# Patient Record
Sex: Male | Born: 1945 | Race: White | Hispanic: No | Marital: Married | Smoking: Former smoker
Health system: Southern US, Community
[De-identification: ages and names within clinical notes are randomized; demographics above are authoritative.]

## PROBLEM LIST (undated history)

## (undated) DIAGNOSIS — G4733 Obstructive sleep apnea (adult) (pediatric): Secondary | ICD-10-CM

## (undated) DIAGNOSIS — M65849 Other synovitis and tenosynovitis, unspecified hand: Secondary | ICD-10-CM

## (undated) DIAGNOSIS — R351 Nocturia: Secondary | ICD-10-CM

## (undated) DIAGNOSIS — Z9989 Dependence on other enabling machines and devices: Secondary | ICD-10-CM

## (undated) DIAGNOSIS — D126 Benign neoplasm of colon, unspecified: Secondary | ICD-10-CM

## (undated) DIAGNOSIS — I471 Supraventricular tachycardia: Secondary | ICD-10-CM

## (undated) DIAGNOSIS — M542 Cervicalgia: Secondary | ICD-10-CM

## (undated) DIAGNOSIS — M65839 Other synovitis and tenosynovitis, unspecified forearm: Secondary | ICD-10-CM

## (undated) DIAGNOSIS — R079 Chest pain, unspecified: Secondary | ICD-10-CM

## (undated) DIAGNOSIS — C4021 Malignant neoplasm of long bones of right lower limb: Secondary | ICD-10-CM

## (undated) DIAGNOSIS — I4719 Other supraventricular tachycardia: Secondary | ICD-10-CM

## (undated) DIAGNOSIS — N529 Male erectile dysfunction, unspecified: Secondary | ICD-10-CM

## (undated) HISTORY — DX: Other synovitis and tenosynovitis, unspecified forearm: M65.839

## (undated) HISTORY — DX: Supraventricular tachycardia: I47.1

## (undated) HISTORY — DX: Obstructive sleep apnea (adult) (pediatric): G47.33

## (undated) HISTORY — DX: Other synovitis and tenosynovitis, unspecified hand: M65.849

## (undated) HISTORY — PX: TUMOR REMOVAL: SHX12

## (undated) HISTORY — DX: Nocturia: R35.1

## (undated) HISTORY — PX: POLYPECTOMY: SHX149

## (undated) HISTORY — DX: Dependence on other enabling machines and devices: Z99.89

## (undated) HISTORY — DX: Cervicalgia: M54.2

## (undated) HISTORY — PX: COLONOSCOPY: SHX174

## (undated) HISTORY — DX: Benign neoplasm of colon, unspecified: D12.6

## (undated) HISTORY — DX: Chest pain, unspecified: R07.9

## (undated) HISTORY — DX: Other supraventricular tachycardia: I47.19

## (undated) HISTORY — DX: Malignant neoplasm of long bones of right lower limb: C40.21

## (undated) HISTORY — DX: Male erectile dysfunction, unspecified: N52.9

---

## 1997-03-10 ENCOUNTER — Encounter: Payer: Self-pay | Admitting: Pulmonary Disease

## 1997-04-12 ENCOUNTER — Encounter: Payer: Self-pay | Admitting: Pulmonary Disease

## 1997-04-28 ENCOUNTER — Encounter: Payer: Self-pay | Admitting: Pulmonary Disease

## 2004-02-10 ENCOUNTER — Ambulatory Visit: Payer: Self-pay | Admitting: Family Medicine

## 2005-10-06 ENCOUNTER — Encounter: Admission: RE | Admit: 2005-10-06 | Discharge: 2005-10-06 | Payer: Self-pay | Admitting: Family Medicine

## 2005-10-06 ENCOUNTER — Ambulatory Visit: Payer: Self-pay | Admitting: Family Medicine

## 2005-10-14 ENCOUNTER — Ambulatory Visit: Payer: Self-pay | Admitting: Internal Medicine

## 2005-12-25 ENCOUNTER — Emergency Department (HOSPITAL_COMMUNITY): Admission: EM | Admit: 2005-12-25 | Discharge: 2005-12-25 | Payer: Self-pay | Admitting: Emergency Medicine

## 2005-12-27 ENCOUNTER — Ambulatory Visit: Payer: Self-pay | Admitting: Family Medicine

## 2005-12-29 ENCOUNTER — Ambulatory Visit: Payer: Self-pay | Admitting: Family Medicine

## 2006-04-19 ENCOUNTER — Encounter: Admission: RE | Admit: 2006-04-19 | Discharge: 2006-07-18 | Payer: Self-pay | Admitting: Neurology

## 2006-04-19 ENCOUNTER — Ambulatory Visit: Payer: Self-pay | Admitting: Family Medicine

## 2006-11-04 ENCOUNTER — Inpatient Hospital Stay (HOSPITAL_COMMUNITY): Admission: EM | Admit: 2006-11-04 | Discharge: 2006-11-07 | Payer: Self-pay | Admitting: Emergency Medicine

## 2007-10-15 ENCOUNTER — Emergency Department (HOSPITAL_COMMUNITY): Admission: EM | Admit: 2007-10-15 | Discharge: 2007-10-15 | Payer: Self-pay | Admitting: Emergency Medicine

## 2007-11-05 ENCOUNTER — Telehealth: Payer: Self-pay | Admitting: Family Medicine

## 2007-11-08 ENCOUNTER — Ambulatory Visit: Payer: Self-pay | Admitting: Family Medicine

## 2007-11-08 DIAGNOSIS — M542 Cervicalgia: Secondary | ICD-10-CM

## 2007-11-08 HISTORY — DX: Cervicalgia: M54.2

## 2007-11-09 ENCOUNTER — Ambulatory Visit: Payer: Self-pay | Admitting: Family Medicine

## 2007-11-13 ENCOUNTER — Telehealth: Payer: Self-pay | Admitting: Family Medicine

## 2007-12-12 ENCOUNTER — Encounter: Payer: Self-pay | Admitting: Family Medicine

## 2007-12-13 ENCOUNTER — Ambulatory Visit: Payer: Self-pay | Admitting: Family Medicine

## 2008-10-20 ENCOUNTER — Ambulatory Visit: Payer: Self-pay | Admitting: Family Medicine

## 2008-10-20 LAB — CONVERTED CEMR LAB
ALT: 21 units/L (ref 0–53)
AST: 17 units/L (ref 0–37)
Alkaline Phosphatase: 73 units/L (ref 39–117)
Basophils Absolute: 0 10*3/uL (ref 0.0–0.1)
Blood in Urine, dipstick: NEGATIVE
Calcium: 9 mg/dL (ref 8.4–10.5)
Eosinophils Relative: 2.8 % (ref 0.0–5.0)
GFR calc non Af Amer: 72.25 mL/min (ref >60)
Glucose, Bld: 99 mg/dL (ref 70–99)
HCT: 45.5 % (ref 39.0–52.0)
HDL: 44 mg/dL (ref >39.00)
Hemoglobin: 15.6 g/dL (ref 13.0–17.0)
Ketones, urine, test strip: NEGATIVE
Lymphocytes Relative: 38.2 % (ref 12.0–46.0)
Lymphs Abs: 3.2 10*3/uL (ref 0.7–4.0)
Monocytes Relative: 7.9 % (ref 3.0–12.0)
Neutro Abs: 4.3 10*3/uL (ref 1.4–7.7)
Nitrite: NEGATIVE
Platelets: 175 10*3/uL (ref 150.0–400.0)
Potassium: 4.7 meq/L (ref 3.5–5.1)
Protein, U semiquant: NEGATIVE
RDW: 12.5 % (ref 11.5–14.6)
Sodium: 145 meq/L (ref 135–145)
Total Bilirubin: 0.9 mg/dL (ref 0.3–1.2)
Triglycerides: 108 mg/dL (ref 0.0–149.0)
Urobilinogen, UA: 0.2
VLDL: 21.6 mg/dL (ref 0.0–40.0)
WBC Urine, dipstick: NEGATIVE
WBC: 8.4 10*3/uL (ref 4.5–10.5)

## 2008-11-13 ENCOUNTER — Ambulatory Visit: Payer: Self-pay | Admitting: Family Medicine

## 2008-11-13 DIAGNOSIS — R079 Chest pain, unspecified: Secondary | ICD-10-CM

## 2008-11-13 DIAGNOSIS — N529 Male erectile dysfunction, unspecified: Secondary | ICD-10-CM

## 2008-11-13 DIAGNOSIS — R351 Nocturia: Secondary | ICD-10-CM

## 2008-11-13 HISTORY — DX: Male erectile dysfunction, unspecified: N52.9

## 2008-11-13 HISTORY — DX: Nocturia: R35.1

## 2008-11-17 ENCOUNTER — Telehealth (INDEPENDENT_AMBULATORY_CARE_PROVIDER_SITE_OTHER): Payer: Self-pay | Admitting: *Deleted

## 2008-11-17 ENCOUNTER — Encounter: Payer: Self-pay | Admitting: *Deleted

## 2008-11-18 ENCOUNTER — Encounter: Payer: Self-pay | Admitting: Cardiology

## 2008-11-18 ENCOUNTER — Ambulatory Visit: Payer: Self-pay

## 2008-11-21 LAB — CONVERTED CEMR LAB: Testosterone: 281.09 ng/dL — ABNORMAL LOW (ref 350.00–890.00)

## 2009-01-01 ENCOUNTER — Encounter (INDEPENDENT_AMBULATORY_CARE_PROVIDER_SITE_OTHER): Payer: Self-pay | Admitting: *Deleted

## 2009-01-21 ENCOUNTER — Encounter (INDEPENDENT_AMBULATORY_CARE_PROVIDER_SITE_OTHER): Payer: Self-pay | Admitting: *Deleted

## 2009-01-29 ENCOUNTER — Ambulatory Visit: Payer: Self-pay | Admitting: Gastroenterology

## 2009-02-04 ENCOUNTER — Ambulatory Visit: Payer: Self-pay | Admitting: Gastroenterology

## 2009-02-04 LAB — HM COLONOSCOPY

## 2009-02-06 ENCOUNTER — Encounter: Payer: Self-pay | Admitting: Gastroenterology

## 2009-02-06 DIAGNOSIS — D126 Benign neoplasm of colon, unspecified: Secondary | ICD-10-CM | POA: Insufficient documentation

## 2009-02-06 HISTORY — DX: Benign neoplasm of colon, unspecified: D12.6

## 2009-02-19 ENCOUNTER — Ambulatory Visit: Payer: Self-pay | Admitting: Genetic Counselor

## 2009-03-06 ENCOUNTER — Encounter: Payer: Self-pay | Admitting: Gastroenterology

## 2009-03-06 ENCOUNTER — Telehealth (INDEPENDENT_AMBULATORY_CARE_PROVIDER_SITE_OTHER): Payer: Self-pay | Admitting: *Deleted

## 2009-03-27 ENCOUNTER — Ambulatory Visit: Payer: Self-pay | Admitting: Family Medicine

## 2009-03-27 DIAGNOSIS — M65849 Other synovitis and tenosynovitis, unspecified hand: Secondary | ICD-10-CM

## 2009-03-27 DIAGNOSIS — M65839 Other synovitis and tenosynovitis, unspecified forearm: Secondary | ICD-10-CM

## 2009-03-27 HISTORY — DX: Other synovitis and tenosynovitis, unspecified forearm: M65.839

## 2009-04-07 ENCOUNTER — Ambulatory Visit: Payer: Self-pay | Admitting: Pulmonary Disease

## 2009-04-07 DIAGNOSIS — G4733 Obstructive sleep apnea (adult) (pediatric): Secondary | ICD-10-CM | POA: Insufficient documentation

## 2009-04-07 DIAGNOSIS — Z9989 Dependence on other enabling machines and devices: Secondary | ICD-10-CM

## 2009-04-17 ENCOUNTER — Telehealth: Payer: Self-pay | Admitting: Pulmonary Disease

## 2009-05-25 ENCOUNTER — Encounter: Payer: Self-pay | Admitting: Pulmonary Disease

## 2009-05-26 ENCOUNTER — Encounter: Payer: Self-pay | Admitting: Pulmonary Disease

## 2009-06-17 ENCOUNTER — Telehealth (INDEPENDENT_AMBULATORY_CARE_PROVIDER_SITE_OTHER): Payer: Self-pay | Admitting: *Deleted
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
## 2010-01-12 ENCOUNTER — Encounter: Payer: Self-pay | Admitting: Gastroenterology

## 2010-03-23 NOTE — Progress Notes (Signed)
Summary: [facility]   Imported By: Sherian Rein 04/07/2009 15:00:14  _____________________________________________________________________  External Attachment:    Type:   Image     Comment:   External Document

## 2010-03-23 NOTE — Assessment & Plan Note (Signed)
Summary: rov for osa, re-establish.   Copy to:  Kelle Darting Primary Provider/Referring Provider:  Roderick Pee MD  CC:  Sleep Consult.  History of Present Illness: The pt comes in today for f/u of his severe osa.  He was diagnosed in 1999 and started on cpap, and never followed up since.  He never had his pressure optimized for him.  He comes in today because he is in need of a mask, with his last one breaking.  He admits that his current cpap machine was the original, and he has noted the pressure seems to be getting less and less.  Bed partner has noticed breakthru snoring.  He goes to bed btw 10-58mn, and arises at 6-7am to start his day.  He has seen a big difference in how he sleeps and feels while wearing cpap, but recently has had an increase in symptoms.  His weight is up 15 pounds over the last one year.  His epworth score today is 7.  Medications Prior to Update: 1)  Multivitamins  Tabs (Multiple Vitamin) .... Once Daily 2)  Aspirin 81 Mg Tbec (Aspirin) .... Once Daily 3)  Prednisone 20 Mg Tabs (Prednisone) .... 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 Family History Diabetes 1st degree relative (mother  Family History of Prostate CA 1st degree relative <50  (father)  heart disease: mother  (CHF)  Social History: Reviewed history from 10/06/2006 and no changes required. former smoker.  started at age 82.  1 ppd.  quit 2011. Married has children. works as a Insurance claims handler. Alcohol use-yes  Review of Systems  The patient denies shortness of  breath with activity, shortness of breath at rest, productive cough, non-productive cough, coughing up blood, chest pain, irregular heartbeats, acid heartburn, indigestion, loss of appetite, weight change, abdominal pain, difficulty swallowing, sore throat, tooth/dental problems, headaches, nasal congestion/difficulty breathing through nose, sneezing, itching, ear ache, anxiety, depression, hand/feet swelling, joint stiffness or pain, rash, change in color of mucus, and fever.    Vital Signs:  Patient profile:   65 year old male Height:      74 inches Weight:      225.50 pounds BMI:     29.06 O2 Sat:      97 % on Room air Temp:     97.8 degrees F oral Pulse rate:   65 / minute BP sitting:   136 / 82  (left arm) Cuff size:   regular  Vitals Entered By: Arman Filter LPN (April 07, 2009 8:59 AM)  O2 Flow:  Room air CC: Sleep Consult Comments Medications reviewed with patient Arman Filter LPN  April 07, 2009 9:04 AM    Physical Exam  General:  ow male in nad  Nose:  narrowed bilat left>right Mouth:  elongation of soft palate and uvula Neck:  no jvd, ln, tmg Lungs:  clear to auscultation Heart:  rrr Extremities:  no edema Neurologic:  alert and oriented, moves all 4.   Impression & Recommendations:  Problem # 1:  OBSTRUCTIVE SLEEP APNEA (ICD-327.23) The pt has a long history of osa, but has not followed up since initial diagnosis.  I have  told him that his pressure was never optimized, but despite this, he feels he has been doing well until recently.  He obviously needs a new machine, supplies and mask.  I have also encouraged him to work on weight loss.  Will take this opportunity to also optimize his pressure.  He will f/u with me in one year if doing well.  Other Orders: Est. Patient Level IV (78469) DME Referral (DME)  Patient Instructions: 1)  will get you a new cpap machine and mask 2)  will set machine on automatic mode for 2 weeks to optimize your pressure.   I will let you know results 3)  work on weight loss 4)  followup with me in one year, but call if issues.

## 2010-03-23 NOTE — Letter (Signed)
Summary: Colonoscopy Letter  [facility]  [address] [place], [place] [address]   Phone: [phone]  Fax: [phone]      January 12, 2010 MRN: [id number]   Alejandro Bailey [address] [place], [place]  [address]   Dear [name],   According to your medical record, it is time for you to schedule a Colonoscopy. The American Cancer Society recommends this procedure as a method to detect early colon cancer. Patients with a family history of colon cancer, or a personal history of colon polyps or inflammatory bowel disease are at increased risk.  This letter has been generated based on the recommendations made at the time of your procedure. If you feel that in your particular situation this may no longer apply, please contact our office.  Please call our office at [phone] to schedule this appointment or to update your records at your earliest convenience.  Thank you for cooperating with [place] to provide you with the very best care possible.   Sincerely,  Rachael Fee, M.D.  [facility] Gastroenterology [facility] [phone]

## 2010-03-23 NOTE — Progress Notes (Signed)
Summary: [facility]   Imported By: Sherian Rein 04/07/2009 15:01:28  _____________________________________________________________________  External Attachment:    Type:   Image     Comment:   External Document

## 2010-03-23 NOTE — Assessment & Plan Note (Signed)
Summary: R ARM / WRIST PAIN // RS   Vital Signs:  Patient profile:   65 year old male Weight:      225 pounds Temp:     98.6 degrees F oral BP sitting:   128 / 80  (left arm) Cuff size:   regular  Vitals Entered By: Kern Reap CMA Duncan Dull) (March 27, 2009 9:41 AM)  Reason for Visit right arm pain  History of Present Illness: Alejandro Bailey is a 65 year old, married male, nonsmoker, who comes in today with a month history of pain in his right arm.  He works on Surveyor, mining all day long.  He is right-handed.  He began noticing pain in the midportion of his left arm on the ventral surface about one month ago.  It hurts when he moves his wrist.  He found the tendon.  This extremely sore.  No history of trauma  he stopped smoking one month ago, cold [place].  His colonoscopy revealed over 20 polyps.  No family history of polyposis.  Advised him to have his two children who are 2 and 21 years of age be screened now   Allergies: No Known Drug Allergies  Past History:  Past medical, surgical, family and social histories (including risk factors) reviewed, and no changes noted (except as noted below). Past medical, surgical, family and social histories (including risk factors) reviewed for relevance to current acute and chronic problems.  Family History: Reviewed history from 10/06/2006 and no changes required. Family History Diabetes 1st degree relative Family History of Prostate CA 1st degree relative <50  Social History: Reviewed history from 10/06/2006 and no changes required. Current Smoker Married Alcohol use-yes  Review of Systems      See HPI  Physical Exam  General:  Well-developed,well-nourished,in no acute distress; alert,appropriate and cooperative throughout examination Msk:  No deformity or scoliosis noted of thoracic or lumbar spine.   Pulses:  R and L carotid,radial,femoral,dorsalis pedis and posterior tibial pulses are full and equal  bilaterally Extremities:  No clubbing, cyanosis, edema, or deformity noted with normal full range of motion of all joints.   Neurologic:  No cranial nerve deficits noted. Station and gait are normal. Plantar reflexes are down-going bilaterally. DTRs are symmetrical throughout. Sensory, motor and coordinative functions appear intact.   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 Once daily 2)  Aspirin 81 Mg Tbec (Aspirin) .... Once daily 3)  Prednisone 20 Mg Tabs (Prednisone) .... Uad  Patient Instructions: 1)  begin prednisone two tabs x 3 days, one x 3 days, a half x 3 days, then half a tablet Monday, Wednesday, Friday, for a two week taper.  Return p.r.n. Prescriptions: PREDNISONE 20 MG TABS (PREDNISONE) UAD  #30 x 1   Entered and Authorized by:   Roderick Pee MD   Signed by:   Roderick Pee MD on 03/27/2009   Method used:   Electronically to        [facility] [phone]* (retail)       [address]       [place], [place]  [address]       Ph: [phone] or [phone]       Fax:  [phone]   RxID:   [id number]

## 2010-03-23 NOTE — Letter (Signed)
Summary: Appointment Reminder  [facility]  [address] [place], [place] [address]   Phone: [phone]  Fax: [phone]        March 06, 2009 MRN: [id number]    Alejandro Bailey [address] [place], [place]  [address]    Dear Mr. Maenza,   We have been unable to reach you by phone to reschedule a Genetics   appointment that was recommended for you by Dr. Christella Hartigan.  It is very   important that we reach you to schedule an appointment. We hope that you  allow [place] to participate in your health care needs. Please contact us at  [phone] at your earliest convenience to schedule your appointment.     Sincerely,    Chales Abrahams CMA (AAMA)  Appended Document: Appointment Reminder letter mailed

## 2010-03-23 NOTE — Progress Notes (Signed)
Summary: c pap  Phone Note Call from Patient   Caller: Patient Call For: clance Summary of Call: pt states lungs are sore in the am questioning whether  c pap machine could be causing it Initial call taken by: Rickard Patience,  June 17, 2009 9:56 AM  Follow-up for Phone Call        Spoke with pt.  Pt states his "lungs are sore" in the mornings but this goes away after he gets up.  States this has been going on off and on since pressure on cpap was increased.  Would like to know if 1.  this could be coming from cpap pressure being increased and/or 2.  if this could be coming from not smoking in 4 months.  Will forward to [facility] - pls advise.  Thanks! Follow-up by: Gweneth Dimitri RN,  June 17, 2009 10:12 AM  Additional Follow-up for Phone Call Additional follow up Details #1::        It is possible the cpap pressure could be doing this. will decrease to 16 and see if difference.  order sent to [facility]. Additional Follow-up by: Barbaraann Share MD,  June 17, 2009 10:20 AM    Additional Follow-up for Phone Call Additional follow up Details #2::    Called, spoke with pt.  Pt informed of above recs per [facility] and aware order has been placed to have pressure decreased.  He verbalized understanding.  Gweneth Dimitri RN  June 17, 2009 10:29 AM

## 2010-03-23 NOTE — Letter (Signed)
Summary: External Correspondence  External Correspondence   Imported By: Valinda Hoar 05/26/2009 08:34:34  _____________________________________________________________________  External Attachment:    Type:   Image     Comment:   External Document

## 2010-03-23 NOTE — Miscellaneous (Signed)
Summary: Orders Update  Clinical Lists Changes  Orders: Added new Referral order of DME Referral (DME) - Signed 

## 2010-03-23 NOTE — Progress Notes (Signed)
Summary: Genetics  Phone Note From Other Clinic   Summary of Call: [facility] called and advised Dr Christella Hartigan that pt cx appt and will not return phone messages.  I also tried to call pt to inquire about missed appt but have been unable to reach pt. Initial call taken by: Chales Abrahams CMA Duncan Dull),  March 06, 2009 10:03 AM  Follow-up for Phone Call        please send letter telling him that he should call to reschedule that genetics appointment, we will help him if needed. Follow-up by: Rachael Fee MD,  March 06, 2009 10:20 AM  Additional Follow-up for Phone Call Additional follow up Details #1::        letter mailed Additional Follow-up by: Chales Abrahams CMA Duncan Dull),  March 06, 2009 10:23 AM

## 2010-03-23 NOTE — Progress Notes (Signed)
Summary: cpap machine  Phone Note Call from Patient Call back at Home Phone [phone] Call back at Work Phone [phone]   Caller: Patient Call For: Arden Tinoco Summary of Call: Wants to know the status of his cpap machine  Initial call taken by: Darletta Moll,  April 17, 2009 8:41 AM  Follow-up for Phone Call        The patient is still waiting to hear from someone regarding his cpap. He says no one from [facility] has called him. Can we check on this for him and call him back to let him know what the hold up is. The order was sent on 04/07/09. Michel Bickers CMA  April 17, 2009 9:20 AM  Order faxed to [facility] on 04/07/09. Per Selena Batten they are waiting on ins verification. Selena Batten will call pt today to arrange set up. Called pt at home and wife stated he has just left for work. Will call pt at work number around 12:00 and advise him of the above. Rhonda Cobb  April 17, 2009 10:41 AM Called pt at work and he was on lunch break. Pt due back around 5:00. Left Message for him to return my call. Alfonso Ramus  April 17, 2009 4:39 PM  Pt spoke with Alinda Money on Friday and he has appt scheduled for Thurs. 04/23/09. Alinda Money will come to pt's home. Advised pt to call me if he needed anything else. Rhonda Cobb  April 20, 2009 10:14 AM

## 2010-04-18 ENCOUNTER — Encounter: Payer: Self-pay | Admitting: Family Medicine

## 2010-04-19 ENCOUNTER — Ambulatory Visit (INDEPENDENT_AMBULATORY_CARE_PROVIDER_SITE_OTHER): Payer: PRIVATE HEALTH INSURANCE | Admitting: Family Medicine

## 2010-04-19 ENCOUNTER — Encounter: Payer: Self-pay | Admitting: Family Medicine

## 2010-04-19 VITALS — BP 140/90 | Temp 98.2°F | Ht 74.0 in | Wt 226.0 lb

## 2010-04-19 DIAGNOSIS — M25519 Pain in unspecified shoulder: Secondary | ICD-10-CM

## 2010-04-19 DIAGNOSIS — M25511 Pain in right shoulder: Secondary | ICD-10-CM

## 2010-04-19 NOTE — Progress Notes (Signed)
  Subjective:    Patient ID: Alejandro Bailey, male    DOB: Sep 21, 1945, 65 y.o.   MRN: [id number]  HPIDonald is a 65 year old, married male, nonsmoker, who comes in today with a 91-month history of pain in his right shoulder.  No history of trauma.  He states about 6 months ago.  His shoulder became sore.  It's gotten worse.  He tried home therapy including anti-inflammatories, but it hasn't helped.    Review of Systems Negative    Objective:   Physical Exam Well-developed well-nourished, male in no acute distress.  Examination of the right shoulder shows decreased abduction, consistent with a possible rotator cuff tear.       Assessment & Plan:  Right shoulder pain.  Refer to Caryn Bee is supple.  Orthopedist

## 2010-04-19 NOTE — Patient Instructions (Signed)
Called Dr. Francena Hanly ,,,,,,,,, at [facility] orthopedics

## 2010-07-09 NOTE — Discharge Summary (Signed)
NAMECORBETT, MOULDER NO.:  [id number]   MEDICAL RECORD NO.:  [id number]          PATIENT TYPE:  INP   LOCATION:  5706                         FACILITY:  [facility]   PHYSICIAN:  Suzanna Obey, M.D.       DATE OF BIRTH:  Feb 03, 1946   DATE OF ADMISSION:  11/04/2006  DATE OF DISCHARGE:  11/07/2006                               DISCHARGE SUMMARY   ADMISSION DIAGNOSIS:  Right tooth abscess.   DISCHARGE DIAGNOSIS:  Right tooth abscess.   SURGICAL PROCEDURES:  None.   HISTORY OF PRESENT ILLNESS:  This is a 65 year old with a 2-week history  of right facial swelling and right tooth pain.  He has been treated with  outpatient antibiotics and had a recent tooth extraction.  He had three  wisdom teeth removed.  The right lower tooth was not removed.  He was  told to see an oral surgeon but has not done so yet.  He was having some  trismus.  No airway issues.  His voice was normal.  He was admitted for  intravenous antibiotics.  He underwent a CT scan which did show some  inflammatory component of induration in the masseter space and  parapharyngeal space but no drainable abscess.  He was admitted and  placed on antibiotics intravenous and he was immediately feeling better  on hospital day #1.  He had decreased swelling.  His voice was normal.  He was transferred to the floor after observation in the ICU.  Dr.  Warren Danes was called to take care of the tooth.  He was taking good  fluids and trismus was substantially better on hospital day #2, and he  had no fever.  The neck was soft and nontender and decreased swelling  around the masseter area.  The oral cavity and oropharynx had no  swelling.  On hospital day #3, he was afebrile and was discharged to  home on clindamycin 300 mg three times a day and to follow-up with Dr.  Warren Danes for the tooth extraction.  He is to call us sooner if anything  is worsening in any way.           ______________________________  Suzanna Obey,  M.D.     JB/MEDQ  D:  12/21/2006  T:  12/21/2006  Job:  [id number]

## 2010-07-09 NOTE — Assessment & Plan Note (Signed)
[facility]                                   ON-CALL NOTE   AVARI, GELLES                      MRN:          [id number]  DATE:12/25/2005                            DOB:          30-Jan-1946    Mr. Alejandro Bailey is a patient of Dr. Tawanna Cooler, who calls in today stating that last  night he noticed a large bulge in his right lower abdomen. It is very  uncomfortable this morning. He states that it is about an egg-sized shape,  and apparently he is having difficulties having bowel movements, secondary  to the this discomfort.   PLAN:  I advised the patient to seek urgent attention at [facility]  emergency room for a possible hernia. The patient expressed understanding.  He states that he is stable enough to drive himself or have a family member  take him.    ______________________________  Leanne Chang, M.D.    LA/MedQ  DD: 12/25/2005  DT: 12/26/2005  Job #: [id number]   cc:   Tinnie Gens A. Tawanna Cooler, MD

## 2010-09-23 ENCOUNTER — Ambulatory Visit (INDEPENDENT_AMBULATORY_CARE_PROVIDER_SITE_OTHER): Payer: PRIVATE HEALTH INSURANCE | Admitting: Family Medicine

## 2010-09-23 ENCOUNTER — Telehealth: Payer: Self-pay | Admitting: *Deleted

## 2010-09-23 ENCOUNTER — Encounter: Payer: Self-pay | Admitting: Family Medicine

## 2010-09-23 VITALS — BP 110/64 | Temp 98.4°F | Wt 223.0 lb

## 2010-09-23 DIAGNOSIS — L0291 Cutaneous abscess, unspecified: Secondary | ICD-10-CM

## 2010-09-23 DIAGNOSIS — L03319 Cellulitis of trunk, unspecified: Secondary | ICD-10-CM

## 2010-09-23 DIAGNOSIS — C4491 Basal cell carcinoma of skin, unspecified: Secondary | ICD-10-CM

## 2010-09-23 MED ORDER — AMOXICILLIN-POT CLAVULANATE 875-125 MG PO TABS: 1.0000 | ORAL_TABLET | Freq: Two times a day (BID) | ORAL | Status: AC

## 2010-09-23 NOTE — Progress Notes (Signed)
  Subjective:    Patient ID: Alejandro Bailey, male    DOB: 03-Feb-1946, 65 y.o.   MRN: [id number]  HPI Four-day history of swelling, pain, and redness right groin region. Long scar in this region from benign tumor excision from orthopedist several years ago. No fever or chills. No drainage. No dysuria.  Incidentally noted about 3 month history of nodular growth forehead.  Nonpainful.     Review of Systems  Constitutional: Negative for fever and chills.  Gastrointestinal: Negative for nausea and vomiting.  Musculoskeletal: Negative for gait problem.  Hematological: Negative for adenopathy.       Objective:   Physical Exam  Constitutional: He appears well-developed and well-nourished.  Cardiovascular: Normal rate and regular rhythm.   Pulmonary/Chest: Effort normal and breath sounds normal. No respiratory distress. He has no wheezes. He has no rales.  Skin:       forehead reveals nodular skin lesion with telangiectasias and slightly umbilicated center  Right groin reveals large scar in the superior aspect of this area of erythema about 2 x 4 cm with slight fluctuance centrally. No puslike drainage. Moderately tender to palpation          Assessment & Plan:  #1 abscess right groin.  Recommended I and D and pt consented.  Prepped skin and anesth with 1%plain xylocaine and incision over area of fluctuance with purulent secreations expressed.  Hemostats to free up some deeper loculations of pus.  Wound cavity packed with gauze.  Outer dressing applied.  Follow up tomorrow to reassess. #2 probable basal cell cancer forehead. Schedule followup with primary to consider biopsy

## 2010-09-23 NOTE — Telephone Encounter (Signed)
Pt complains of what sounds like an infected cyst in groin or small hernia.  Appt scheduled with Dr. Caryl Never today.

## 2010-09-24 ENCOUNTER — Encounter: Payer: Self-pay | Admitting: Family Medicine

## 2010-09-24 ENCOUNTER — Ambulatory Visit: Payer: PRIVATE HEALTH INSURANCE | Admitting: Family Medicine

## 2010-09-24 ENCOUNTER — Ambulatory Visit (INDEPENDENT_AMBULATORY_CARE_PROVIDER_SITE_OTHER): Payer: PRIVATE HEALTH INSURANCE | Admitting: Family Medicine

## 2010-09-24 VITALS — BP 100/70 | Temp 98.1°F

## 2010-09-24 DIAGNOSIS — L0291 Cutaneous abscess, unspecified: Secondary | ICD-10-CM

## 2010-09-24 DIAGNOSIS — L0390 Cellulitis, unspecified: Secondary | ICD-10-CM

## 2010-09-24 NOTE — Patient Instructions (Signed)
Salt water soaks for the next couple of days. Follow up promptly for any fever or recurrent redness or swelling.

## 2010-09-24 NOTE — Progress Notes (Signed)
  Subjective:    Patient ID: Alejandro Bailey, male    DOB: 1945-04-16, 65 y.o.   MRN: [id number]  HPI Followup right groin abscess. Incision and drainage yesterday. Unfortunately packing came out sometime last night. Overall feels much better. Less pain. No fever or chills. Less erythema. Taking Augmentin without side effect. He has not yet started warm soaks. No history of diabetes   Review of Systems  Constitutional: Negative for fever and chills.  Genitourinary: Negative for dysuria.  Hematological: Negative for adenopathy.       Objective:   Physical Exam  Constitutional: He appears well-developed and well-nourished.  Cardiovascular: Normal rate and regular rhythm.   Pulmonary/Chest: Effort normal and breath sounds normal. No respiratory distress. He has no wheezes.  Skin:       Right upper inguinal region examined. He has area of scar tissue as previously described. Incision from yesterday with much less erythema and great reduction in swelling. No persistent fluctuance. No active drainage. Much less tender          Assessment & Plan:  Improving abscess right thigh. Packing came out spontaneously. Start warm sitz baths with Epsom salts twice daily starting later today. Continue Augmentin

## 2010-10-04 ENCOUNTER — Ambulatory Visit (INDEPENDENT_AMBULATORY_CARE_PROVIDER_SITE_OTHER): Payer: PRIVATE HEALTH INSURANCE | Admitting: Family Medicine

## 2010-10-04 ENCOUNTER — Encounter: Payer: Self-pay | Admitting: Family Medicine

## 2010-10-04 VITALS — BP 120/70 | Temp 98.6°F | Wt 225.0 lb

## 2010-10-04 DIAGNOSIS — L989 Disorder of the skin and subcutaneous tissue, unspecified: Secondary | ICD-10-CM

## 2010-10-04 DIAGNOSIS — C44310 Basal cell carcinoma of skin of unspecified parts of face: Secondary | ICD-10-CM | POA: Insufficient documentation

## 2010-10-04 DIAGNOSIS — C44319 Basal cell carcinoma of skin of other parts of face: Secondary | ICD-10-CM

## 2010-10-04 NOTE — Progress Notes (Signed)
  Subjective:    Patient ID: Alejandro Bailey, male    DOB: 1945/04/30, 65 y.o.   MRN: [id number]  HPI Alejandro Bailey is a 65 year old, married male, nonsmoker, who comes in today for removal of a lesion in his mid forehead.  He states about 3 months ago he noticed a spot on his med for it.  Initially thought it was a wart and went and got some over-the-counter anti-wart medication.  It didn't seem to help him.  When he was in here last week for a boil in his groin that Dr. Caryl Never treated with and TBI.  Next, and hot soaks, Dr. Caryl Never recommended he come back and have it removed.  He states he had side effects.  The Augmentin he developed voice loss and he stopped it.  A couple days later, the voice loss when a wide.  No hives.  He was taken to the treatment room and after informed consent, the lesion was anesthetized with 1% Xylocaine with epinephrine.  It was excised with 3-mm margins.  Base was cauterized.  Dry, sterile bandage was applied.  The lesion was sent for pathologic analysis.  He left the office in good condition with no side effects from the procedure.  Clinically, it appears to be a basal cell carcinoma   Review of Systems    Negative Objective:   Physical Exam  Procedure see above     Assessment & Plan:  Lesion mid forehead removed, probable basal cell carcinoma, sent for pathologic analysis

## 2010-12-03 LAB — I-STAT 8, (EC8 V) (CONVERTED LAB)
BUN: 6
Bicarbonate: 27.9 — ABNORMAL HIGH
Chloride: 100
HCT: 47
Hemoglobin: 16
Operator id: 294521
Potassium: 4.1
Sodium: 136

## 2010-12-03 LAB — DIFFERENTIAL
Eosinophils Relative: 1
Lymphocytes Relative: 20
Lymphs Abs: 2.3
Monocytes Absolute: 0.9 — ABNORMAL HIGH
Monocytes Relative: 8
Neutro Abs: 8.2 — ABNORMAL HIGH

## 2010-12-03 LAB — CBC
HCT: 43.5
Hemoglobin: 14.6
RBC: 4.82

## 2011-03-01 ENCOUNTER — Ambulatory Visit (INDEPENDENT_AMBULATORY_CARE_PROVIDER_SITE_OTHER): Payer: PRIVATE HEALTH INSURANCE | Admitting: Family Medicine

## 2011-03-01 ENCOUNTER — Encounter: Payer: Self-pay | Admitting: Family Medicine

## 2011-03-01 VITALS — BP 140/80 | Temp 97.8°F | Wt 227.0 lb

## 2011-03-01 DIAGNOSIS — D0460 Carcinoma in situ of skin of unspecified upper limb, including shoulder: Secondary | ICD-10-CM

## 2011-03-01 NOTE — Patient Instructions (Signed)
Remove the Band-Aid tomorrow.  Within two weeks.  We will call you the report.  If we do not call you within two weeks.  Call us

## 2011-03-01 NOTE — Progress Notes (Signed)
  Subjective:    Patient ID: RONZELL LABAN, male    DOB: 10/03/1945, 66 y.o.   MRN: [id number]  HPIDonald is a 66 year old male, who comes in today for removal of a lesion on his left upper arm.  He noted this lesion and right before Thanksgiving.  It was red with a central depression, and over the last couple, weeks it's grown.  He has a history of a basal cell carcinoma removed from his face.  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  After informed consent, the lesion was anesthetized with 1% Xylocaine with epinephrine.  It was excised with two to 3-mm margins.  The base was cauterized.  The dry sterile dressing was applied.  The lesion was sent for pathologic analysis.  Clinically, again, it appears to be a squamous cell carcinoma.  He tolerated the procedure well.  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

## 2011-11-04 ENCOUNTER — Encounter: Payer: Self-pay | Admitting: Gastroenterology

## 2012-09-11 ENCOUNTER — Encounter: Payer: Self-pay | Admitting: Gastroenterology

## 2012-09-11 ENCOUNTER — Ambulatory Visit (AMBULATORY_SURGERY_CENTER): Payer: Medicare Other | Admitting: *Deleted

## 2012-09-11 VITALS — Ht 74.0 in | Wt 218.4 lb

## 2012-09-11 DIAGNOSIS — Z86010 Personal history of colonic polyps: Secondary | ICD-10-CM

## 2012-09-11 MED ORDER — MOVIPREP 100 G PO SOLR: 1.0000 | Freq: Once | ORAL | Status: DC

## 2012-09-11 NOTE — Progress Notes (Signed)
No egg or soy product allergy. ewm No problems with past sedation. ewm No home 02 use. But does use CPAP. ewm Dr Christella Hartigan did last colon 01/2009. ewm

## 2012-09-24 ENCOUNTER — Telehealth: Payer: Self-pay | Admitting: Gastroenterology

## 2012-09-24 NOTE — Telephone Encounter (Signed)
Called Alejandro Bailey at [facility] informing her that we have a coupon for free Moviprep for patient.  All information was given over the phone to pharmacist and she entered it in computer.  Coupon is expired but it still should be honored.  She is going to call the company and inquire on what she needs to do and to verify it is honored.  She is to call me back.

## 2012-09-24 NOTE — Telephone Encounter (Signed)
Kathie Rhodes called me back from [facility] and the company would not honor the expired coupons as of July 1st.  Called Chales Abrahams, CMA and she sent up a coupon that is not expired.  Called Kathie Rhodes back and she put in coupon information once again and it did take this time.  Called patient and advised him to go to [facility] and pick up the Moviprep at not cost to him,spoke to his wife.

## 2012-09-25 ENCOUNTER — Ambulatory Visit (AMBULATORY_SURGERY_CENTER): Payer: Medicare Other | Admitting: Gastroenterology

## 2012-09-25 ENCOUNTER — Encounter: Payer: Self-pay | Admitting: Gastroenterology

## 2012-09-25 VITALS — BP 107/58 | HR 52 | Temp 97.6°F | Resp 16 | Ht 74.0 in | Wt 218.0 lb

## 2012-09-25 DIAGNOSIS — K5730 Diverticulosis of large intestine without perforation or abscess without bleeding: Secondary | ICD-10-CM

## 2012-09-25 DIAGNOSIS — Z86010 Personal history of colonic polyps: Secondary | ICD-10-CM

## 2012-09-25 DIAGNOSIS — D126 Benign neoplasm of colon, unspecified: Secondary | ICD-10-CM

## 2012-09-25 MED ORDER — SODIUM CHLORIDE 0.9 % IV SOLN: 500.0000 mL | INTRAVENOUS | Status: DC

## 2012-09-25 NOTE — Progress Notes (Signed)
Patient did not experience any of the following events: a burn prior to discharge; a fall within the facility; wrong site/side/patient/procedure/implant event; or a hospital transfer or hospital admission upon discharge from the facility. (G8907) Patient did not have preoperative order for IV antibiotic SSI prophylaxis. (G8918)  

## 2012-09-25 NOTE — Op Note (Signed)
[facility] [address] [place], [address]   COLONOSCOPY PROCEDURE REPORT  PATIENT: Alejandro Bailey, Alejandro Bailey  MR#: [id number] BIRTHDATE: 02/09/1946 , 67  yrs. old GENDER: Male ENDOSCOPIST: Rachael Fee, MD PROCEDURE DATE:  09/25/2012 PROCEDURE:   Colonoscopy with snare polypectomy First Screening Colonoscopy - Avg.  risk and is 50 yrs.  old or older - No.  Prior Negative Screening - Now for repeat screening. N/A  History of Adenoma - Now for follow-up colonoscopy & has been > or = to 3 yrs.  Yes hx of adenoma.  Has been 3 or more years since last colonoscopy.  Polyps Removed Today? Yes. ASA CLASS:   Class II INDICATIONS:25 adenomatous polyps removed 2010, he was recommended to have repeat examination at 1 year and to have genetic councilor consultation. MEDICATIONS: Fentanyl 62.5 mcg IV, Versed 6 mg IV, and These medications were titrated to patient response per physician's verbal order  DESCRIPTION OF PROCEDURE:   After the risks benefits and alternatives of the procedure were thoroughly explained, informed consent was obtained.  A digital rectal exam revealed no abnormalities of the rectum.   The LB [id number]  endoscope was introduced through the anus and advanced to the cecum, which was identified by both the appendix and ileocecal valve. No adverse events experienced.   The quality of the prep was good.  The instrument was then slowly withdrawn as the colon was fully examined.  COLON FINDINGS: Two polyps were found, removed and sent to pathology.  These were both sessile, 5-98mm across, located in asending segment, both removed with cold snare.  There were several small diverticulum in the left colon.  The examination was otherwise normal.  Retroflexed views revealed no abnormalities. The time to cecum=3 minutes 35 seconds.  Withdrawal time=11 minutes 42 seconds.  The scope was withdrawn and the procedure completed. COMPLICATIONS: There were no  complications.  ENDOSCOPIC IMPRESSION: Two polyps were found, removed and sent to pathology.There were several small diverticulum in the left colon.  The examination was otherwise normal.  RECOMMENDATIONS: Given your unusually high number of polyps (25 polyps removed in 2010) I will likely recommend repeat colonoscopy in 2 years.  You will receive a letter within 1-2 weeks with the results of your biopsy as well as final recommendations.  Please call my office if you have not received a letter after 3 weeks.  eSigned:  Rachael Fee, MD 09/25/2012 10:06 AM   cc:  Kelle Darting, MD

## 2012-09-25 NOTE — Patient Instructions (Addendum)
Discharge instructions given with verbal understanding. Handout on polyps. Resume previous medications. YOU HAD AN ENDOSCOPIC PROCEDURE TODAY AT THE [facility]: Refer to the procedure report that was given to you for any specific questions about what was found during the examination.  If the procedure report does not answer your questions, please call your gastroenterologist to clarify.  If you requested that your care partner not be given the details of your procedure findings, then the procedure report has been included in a sealed envelope for you to review at your convenience later.  YOU SHOULD EXPECT: Some feelings of bloating in the abdomen. Passage of more gas than usual.  Walking can help get rid of the air that was put into your GI tract during the procedure and reduce the bloating. If you had a lower endoscopy (such as a colonoscopy or flexible sigmoidoscopy) you may notice spotting of blood in your stool or on the toilet paper. If you underwent a bowel prep for your procedure, then you may not have a normal bowel movement for a few days.  DIET: Your first meal following the procedure should be a light meal and then it is ok to progress to your normal diet.  A half-sandwich or bowl of soup is an example of a good first meal.  Heavy or fried foods are harder to digest and may make you feel nauseous or bloated.  Likewise meals heavy in dairy and vegetables can cause extra gas to form and this can also increase the bloating.  Drink plenty of fluids but you should avoid alcoholic beverages for 24 hours.  ACTIVITY: Your care partner should take you home directly after the procedure.  You should plan to take it easy, moving slowly for the rest of the day.  You can resume normal activity the day after the procedure however you should NOT DRIVE or use heavy machinery for 24 hours (because of the sedation medicines used during the test).    SYMPTOMS TO REPORT IMMEDIATELY: A  gastroenterologist can be reached at any hour.  During normal business hours, 8:30 AM to 5:00 PM Monday through Friday, call [phone].  After hours and on weekends, please call the GI answering service at [phone] who will take a message and have the physician on call contact you.   Following lower endoscopy (colonoscopy or flexible sigmoidoscopy):  Excessive amounts of blood in the stool  Significant tenderness or worsening of abdominal pains  Swelling of the abdomen that is new, acute  Fever of 100F or higher  FOLLOW UP: If any biopsies were taken you will be contacted by phone or by letter within the next 1-3 weeks.  Call your gastroenterologist if you have not heard about the biopsies in 3 weeks.  Our staff will call the home number listed on your records the next business day following your procedure to check on you and address any questions or concerns that you may have at that time regarding the information given to you following your procedure. This is a courtesy call and so if there is no answer at the home number and we have not heard from you through the emergency physician on call, we will assume that you have returned to your regular daily activities without incident.  SIGNATURES/CONFIDENTIALITY: You and/or your care partner have signed paperwork which will be entered into your electronic medical record.  These signatures attest to the fact that that the information above on your After Visit Summary has been   reviewed and is understood.  Full responsibility of the confidentiality of this discharge information lies with you and/or your care-partner. 

## 2012-09-26 ENCOUNTER — Telehealth: Payer: Self-pay | Admitting: *Deleted

## 2012-09-26 NOTE — Telephone Encounter (Signed)
  Follow up Call-  Call back number 09/25/2012  Post procedure Call Back phone  # [phone]  Permission to leave phone message Yes     Patient questions:  Do you have a fever, pain , or abdominal swelling? no Pain Score  0 *  Have you tolerated food without any problems? yes  Have you been able to return to your normal activities? yes  Do you have any questions about your discharge instructions: Diet   no Medications  no Follow up visit  no  Do you have questions or concerns about your Care? no  Actions: * If pain score is 4 or above: No action needed, pain <4.

## 2012-10-08 ENCOUNTER — Encounter: Payer: Self-pay | Admitting: Gastroenterology

## 2013-09-02 ENCOUNTER — Ambulatory Visit (INDEPENDENT_AMBULATORY_CARE_PROVIDER_SITE_OTHER): Payer: Medicare Other | Admitting: Pulmonary Disease

## 2013-09-02 ENCOUNTER — Encounter: Payer: Self-pay | Admitting: Pulmonary Disease

## 2013-09-02 VITALS — BP 112/68 | HR 55 | Temp 98.7°F | Ht 74.0 in | Wt 216.2 lb

## 2013-09-02 DIAGNOSIS — G4733 Obstructive sleep apnea (adult) (pediatric): Secondary | ICD-10-CM

## 2013-09-02 NOTE — Patient Instructions (Signed)
Will get you new supplies and a mask followup with me again in one year if doing well, but call if there are issues.

## 2013-09-02 NOTE — Progress Notes (Signed)
Subjective:    Patient ID: Alejandro Bailey, male    DOB: 30-Mar-1945, 68 y.o.   MRN: [id number]  HPI The patient is a 68 year old male who comes in today to reestablish for management of obstructive sleep apnea. He was diagnosed in 1999 with severe OSA, with an AHI of 62 events per hour. He was started on CPAP, and then last seen in 2011 where he was ordered a new machine. The patient has not followed up since that time, but has states compliant with his CPAP. He feels that he sleeps well with the device, and is satisfied with his restfulness upon awakening. His machine is functioning properly, but his headgear and mask are worn out. These have not been replaced in years. He feels that he sleeps well with the device, and has excellent alertness with inactivity during the day. His Epworth score today is only one. The patient's weight is also neutral from his visit in 1999 at the time of his original sleep study.   Sleep Questionnaire What time do you typically go to bed?( Between what hours) 10-11pm 10-11pm at 1357 on 09/02/13 by Lilli Few, CMA How long does it take you to fall asleep? 15-30 miniutes 15-30 miniutes at 1357 on 09/02/13 by Lilli Few, CMA How many times during the night do you wake up? 1 1 at 1357 on 09/02/13 by Lilli Few, CMA What time do you get out of bed to start your day? 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 (In pounds) 15 lb (6.804 kg) 15 lb (6.804 kg) at 1357 on 09/02/13 by Lilli Few, CMA Have you ever had a sleep study before? Yes Yes at 1357 on 09/02/13 by Lilli Few, CMA If yes, location of study? [place] at 1357 on 09/02/13 by Lilli Few, CMA If yes, date of study? Do you currently use CPAP?  Yes Yes at 1357 on 09/02/13 by Lilli Few, CMA If so, what pressure? 19 19 at 1357 on 09/02/13 by Lilli Few, CMA Do you wear oxygen at any time? No No at 1357 on 09/02/13 by Lilli Few, CMA   Review of Systems  Constitutional: Negative for fever and unexpected weight change.  HENT: Negative for congestion, dental problem, ear pain, nosebleeds, postnasal drip, rhinorrhea, sinus pressure, sneezing, sore throat and trouble swallowing.   Eyes: Negative for redness and itching.  Respiratory: Negative for cough, chest tightness, shortness of breath and wheezing.   Cardiovascular: Negative for palpitations and leg swelling.  Gastrointestinal: Negative for nausea and vomiting.  Genitourinary: Negative for dysuria.  Musculoskeletal: Negative for joint swelling.  Skin: Negative for rash.  Neurological: Negative for headaches.  Hematological: Does not bruise/bleed easily.  Psychiatric/Behavioral: Negative for dysphoric mood. The patient is not nervous/anxious.        Objective:   Physical Exam Constitutional:  Well developed, no acute distress  HENT:  Nares nearly obstructed bilat  Oropharynx without exudate, palate and uvula are thick and elongated.    Eyes:  Perrla, eomi, no scleral icterus  Neck:  No JVD, no TMG  Cardiovascular:  Normal rate, regular rhythm, no rubs or gallops.  No murmurs        Intact distal pulses  Pulmonary :  Normal breath sounds, no stridor or respiratory distress   No rales, rhonchi, or wheezing  Abdominal:  Soft, nondistended, bowel sounds present.  No tenderness noted.   Musculoskeletal:  No lower extremity edema noted.  Lymph Nodes:  No cervical lymphadenopathy noted  Skin:  No cyanosis noted  Neurologic:  Alert, appropriate, moves all 4 extremities without obvious deficit.         Assessment & Plan:

## 2013-09-02 NOTE — Assessment & Plan Note (Signed)
The patient has a history of severe obstructive sleep apnea, and is currently doing very well with his CPAP. He feels the machine is functioning well, but is overdue for supplies. He is satisfied with his sleep and daytime alertness. I will send an order to his home care company for new supplies and a mask, and I have stressed to him the importance of seeing me on a yearly basis.

## 2013-09-06 ENCOUNTER — Telehealth: Payer: Self-pay | Admitting: Pulmonary Disease

## 2013-09-06 NOTE — Telephone Encounter (Signed)
Called and spoke to pt. Pt stated he called [facility] because he hasn't heard anything about his new cpap supplies in over a week. [facility] stated they didn't receive his sleep study from 1999. Per EMR the sleep study was faxed on 09/02/2013 by [facility]. Called [facility] and questioned if they received the sleep study and if anything else is needed. They stated they received the sleep study fax and they are currently processing the order. Called pt and made him aware of status. Pt verbalized understanding and denied any further questions or concerns at this time.

## 2013-10-01 ENCOUNTER — Encounter: Payer: Self-pay | Admitting: Gastroenterology

## 2014-02-25 ENCOUNTER — Encounter: Payer: Self-pay | Admitting: Cardiology

## 2014-09-03 ENCOUNTER — Ambulatory Visit: Payer: Medicare Other | Admitting: Pulmonary Disease

## 2014-09-04 ENCOUNTER — Encounter: Payer: Self-pay | Admitting: Gastroenterology

## 2014-09-05 ENCOUNTER — Ambulatory Visit (INDEPENDENT_AMBULATORY_CARE_PROVIDER_SITE_OTHER): Payer: Medicare Other | Admitting: Pulmonary Disease

## 2014-09-05 ENCOUNTER — Encounter: Payer: Self-pay | Admitting: Pulmonary Disease

## 2014-09-05 VITALS — BP 120/68 | HR 62 | Ht 74.0 in | Wt 212.8 lb

## 2014-09-05 DIAGNOSIS — G4733 Obstructive sleep apnea (adult) (pediatric): Secondary | ICD-10-CM

## 2014-09-05 DIAGNOSIS — Z9989 Dependence on other enabling machines and devices: Principal | ICD-10-CM

## 2014-09-05 NOTE — Progress Notes (Signed)
69 year old man, [place] pt  For FU of obstructive sleep apnea.  1999 PSG  - severe OSA, with an AHI of 62 events per hour. He was started on CPAP, and then last seen in 2011 where he was ordered a new machine.  Lost to follow-up until 08/2013  09/05/2014  Chief Complaint  Patient presents with  . Sleep Apnea    Former [place] patient; CPAP doing well.  straps from CPAP causing permanent marks on skin.  wants to know if there is anything to help with that.     Annual follow-up  Feels well , CPAP is working well , denies daytime sleepiness or snoring -feels refreshed on waking up , straps are leaving marks on his face  DME has been good with supplies  Mask okay , pressure okay  He does report some dryness  08/2014 Download on CPAP 16 -AHI 8/h   Past Medical History  Diagnosis Date  . 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 Nocturia 11/13/2008  . 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 TENDINITIS, LEFT WRIST 03/27/2009  . NECK AND BACK PAIN 11/08/2007  . CHEST PAIN UNSPECIFIED 11/13/2008  . Obstructive sleep apnea on CPAP    neg for any significant sore throat, dysphagia, itching, sneezing, nasal congestion or excess/ purulent secretions, fever, chills, sweats, unintended wt loss, pleuritic or exertional cp, hempoptysis, orthopnea pnd or change in chronic leg swelling. Also denies presyncope, palpitations, heartburn, abdominal pain, nausea, vomiting, diarrhea or change in bowel or urinary habits, dysuria,hematuria, rash, arthralgias, visual complaints, headache, numbness weakness or ataxia.  Gen. Pleasant, well-nourished, in no distress ENT - no lesions, no post nasal drip Neck: No JVD, no thyromegaly, no carotid bruits Lungs: no use of accessory muscles, no dullness to percussion, clear without rales or rhonchi  Cardiovascular: Rhythm regular, heart sounds  normal, no murmurs or gallops, no peripheral edema Musculoskeletal: No deformities, no cyanosis or clubbing

## 2014-09-05 NOTE — Patient Instructions (Signed)
OK to turn up humidity to 3 CPAP supplies will be renewed x 1 year CPAP is set at 16 cm

## 2014-09-06 NOTE — Assessment & Plan Note (Signed)
He does have some residual events on CPAP 16 cm-but appears to be well-controlled with symptoms hence we'll continue same pressure  Increase Adderall to 10 mg twice daily -can take second dose around  1 pm Try to nap at least twice daily His weight is neutral-and he is compliant with good results  Weight loss encouraged, compliance with goal of at least 4-6 hrs every night is the expectation. Advised against medications with sedative side effects Cautioned against driving when sleepy - understanding that sleepiness will vary on a day to day basis

## 2014-09-11 ENCOUNTER — Encounter: Payer: Self-pay | Admitting: Pulmonary Disease

## 2015-09-23 ENCOUNTER — Telehealth: Payer: Self-pay | Admitting: Pulmonary Disease

## 2015-09-24 NOTE — Telephone Encounter (Signed)
lmtcb x2 for pt. 

## 2015-09-24 NOTE — Telephone Encounter (Signed)
lmomtcb x1 for pt 

## 2015-09-25 NOTE — Telephone Encounter (Signed)
Spoke with pt. Advised him that we do not have any openings on 10/06/2015. States that he will call back later to make appointment. Nothing further was needed at this time.

## 2018-10-15 ENCOUNTER — Telehealth: Payer: Self-pay | Admitting: Cardiology

## 2018-10-15 NOTE — Telephone Encounter (Signed)
LVM for patient to call the office back and schedule new patient appointment with Dr. Ellyn Hack.

## 2018-11-29 ENCOUNTER — Encounter: Payer: Self-pay | Admitting: Cardiology

## 2018-11-30 ENCOUNTER — Other Ambulatory Visit: Payer: Self-pay

## 2018-11-30 ENCOUNTER — Ambulatory Visit: Payer: Medicare Other | Admitting: Cardiology

## 2018-11-30 ENCOUNTER — Encounter: Payer: Self-pay | Admitting: Cardiology

## 2018-11-30 VITALS — BP 120/70 | HR 56 | Temp 97.3°F | Ht 74.0 in | Wt 203.0 lb

## 2018-11-30 DIAGNOSIS — I493 Ventricular premature depolarization: Secondary | ICD-10-CM | POA: Diagnosis not present

## 2018-11-30 DIAGNOSIS — R001 Bradycardia, unspecified: Secondary | ICD-10-CM | POA: Insufficient documentation

## 2018-11-30 DIAGNOSIS — G4733 Obstructive sleep apnea (adult) (pediatric): Secondary | ICD-10-CM

## 2018-11-30 DIAGNOSIS — R072 Precordial pain: Secondary | ICD-10-CM

## 2018-11-30 DIAGNOSIS — Z8679 Personal history of other diseases of the circulatory system: Secondary | ICD-10-CM | POA: Diagnosis not present

## 2018-11-30 DIAGNOSIS — Z9989 Dependence on other enabling machines and devices: Secondary | ICD-10-CM

## 2018-11-30 NOTE — Patient Instructions (Addendum)
Medication Instructions:  NO CHANGES  If you need a refill on your cardiac medications before your next appointment, please call your pharmacy.   Lab work: NOT NEEDED   Testing/Procedures: WILL BE SCHEDULE AT [place] [facility] Your physician has requested that you have an echocardiogram. Echocardiography is a painless test that uses sound waves to create images of your heart. It provides your doctor with information about the size and shape of your heart and how well your heart's chambers and valves are working. This procedure takes approximately one hour. There are no restrictions for this procedure.  AND Your physician has recommended that you wear a 14 DAY ZIO-PATCH monitor. The Zio patch cardiac monitor continuously records heart rhythm data for up to 14 days, this is for patients being evaluated for multiple types heart rhythms. For the first 24 hours post application, please avoid getting the Zio monitor wet in the shower or by excessive sweating during exercise. After that, feel free to carry on with regular activities. Keep soaps and lotions away from the ZIO XT Patch.  This will be placed at our [facility] location - [address].        Follow-Up: At [facility], you and your health needs are our priority.  As part of our continuing mission to provide you with exceptional heart care, we have created designated Provider Care Teams.  These Care Teams include your primary Cardiologist (physician) and Advanced Practice Providers (APPs -  Physician Assistants and Nurse Practitioners) who all work together to provide you with the care you need, when you need it. . You will need a follow up appointment in 4-6 WEEKS - VIRTUAL IS OKAY .  Please call our office 2 months in advance to schedule this appointment.  You may see Glenetta Hew, MD or one of the following Advanced Practice Providers on your designated Care Team:   . Rosaria Ferries, PA-C . Jory Sims, DNP, ANP  Any Other Special Instructions Will Be Listed Below (If Applicable).

## 2018-11-30 NOTE — Progress Notes (Signed)
PCP: Cyndi Bender, PA-C  Clinic Note: Chief Complaint  Patient presents with   New Patient (Initial Visit)   Irregular Heart Beat    fequent PVCs & dizziness, bradycardia     HPI:    Alejandro Bailey is a 73 y.o. male who is being seen today for the evaluation of frequent PVCs at the request of Lam, Rudi Rummage, NP / Cyndi Bender, PA-C  Alejandro Bailey was seen by Philmore Pali, NP for a walk-in visit with complains of dizziness and fatigue back on October 12, 2018.  He said that for about a week leading up to this he did feel a bit tired and weak.  He did feel the pounding sensation of PVCs and irregular heartbeats.  He felt it pulsating but not fast.  1 prominent episode he noted was he been out fishing and he was carrying the prescription and back to the car and he noticed a lot of palpation at that time he felt a little short of breath.  Otherwise really he has been doing fine.  He noted feeling lightheaded dizzy but denied any chest pain. --I do not have the EKG, but according to the patient, it was reportedly PVCs with trigeminy  Recent Hospitalizations: None  Reviewed  CV studies:   The following studies were reviewed today: (if available, images/films reviewed: From Epic Chart or Care Everywhere)  Myoview from years ago reviewed and updated in [facility] below.:  Interval History:    Alejandro Bailey presents here today about 6 weeks out from his visit that his PCPs office indicating that now he is not really had any more the fatigue and dizziness type symptoms.  None of the week tired sensation he was having.  He still feels occasional palpitations.  (Last night while driving home from activity he felt some palpitations irregular heartbeats.  Sometimes he feels that if he is been stressed or usually is after exertion.  No associated dizziness, wooziness or lightheadedness with these spells (no syncope or near syncope).  Never feels his heart rate going up.  He does not usually feel the symptoms  when his heart rate goes up with activity.   Accordingly, he denies any chest pain or pressure with rest or exertion.  No resting dyspnea.  No heart failure symptoms of PND, orthopnea or edema.  No TIA/amaurosis fugax symptoms. No claudication.   Reviewed Of Systems   ROS: A comprehensive was performed.  Symptoms noted since PCP visit Review of Systems  Constitutional: Negative for malaise/fatigue (Energy is back to normal now.).  HENT: Negative for congestion and nosebleeds.   Respiratory: Negative for cough, shortness of breath and wheezing.   Cardiovascular: Positive for palpitations.  Gastrointestinal: Negative for blood in stool, constipation, heartburn, melena, nausea and vomiting.  Genitourinary: Negative for dysuria and hematuria.  Musculoskeletal: Positive for back pain and joint pain. Negative for falls.       Chronic mild arthritis pains  Neurological: Negative for dizziness (No longer feeling dizzy), tingling, focal weakness, weakness and headaches.  Psychiatric/Behavioral: Negative for depression and memory loss. The patient is not nervous/anxious and does not have insomnia.   All other systems reviewed and are negative.  I have reviewed and (if needed) personally updated the patient's problem list, medications, allergies, past medical and surgical history, social and family history.   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  Past Surgical History   Past Surgical History:  Procedure Laterality Date   COLONOSCOPY     NM  MYOVIEW LTD  10/2008   EF 60-70%.  No ischemia or infarction   POLYPECTOMY     TUMOR REMOVAL     Right femoral intertrochanteric area.  Osteosarcoma    Medications/Allergies   Current Meds  Medication Sig   acetaminophen (TYLENOL) 650 MG CR tablet Take 650 mg by mouth daily.   Multiple Vitamin (MULTIVITAMIN) tablet Take 1 tablet by mouth daily.      No Known Allergies   Social History/Family History   Social History   Tobacco Use   Smoking status: Former Smoker    Packs/day: 1.00    Years: 50.00    Pack years: 50.00    Types: Cigarettes    Quit date: 02/21/2009    Years since quitting: 9.7   Smokeless tobacco: Never Used  Substance Use Topics   Alcohol use: Yes    Comment: occ beer 1-2 times a week   Drug use: No   Social History   Social History Narrative   He is a married father of 2 with 2 grandchildren and 2 great-grandchildren.   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      Former smoker quit in 2006.  Intermittent alcohol, rare   Very active with work, but does not do routine exercise.   Says he is "always on the go"-if not working, doing yard work and odd jobs.  Also enjoys fishing.    family history includes Cancer in his father; Diabetes in his brother and mother; Heart disease in his mother; Heart failure in his maternal grandmother and mother.  Wt Readings from Last 3 Encounters:  11/30/18 203 lb (92.1 kg)  09/05/14 212 lb 12.8 oz (96.5 kg)  09/02/13 216 lb 3.2 oz (98.1 kg)     Objctive   Physical Exam: BP 120/70    Pulse (!) 56    Temp (!) 97.3 F (36.3 C)    Ht 6\' 2"  (1.88 m)    Wt 203 lb (92.1 kg)    SpO2 97%    BMI 26.06 kg/m  Physical Exam  Constitutional: He is oriented to person, place, and time. He appears well-developed and well-nourished. No distress.  Healthy-appearing.  Well-groomed  HENT:  Head: Normocephalic and  atraumatic.  Eyes: Pupils are equal, round, and reactive to light. Conjunctivae and EOM are normal. No scleral icterus.  Neck: Normal range of motion. Neck supple. No hepatojugular reflux and no JVD present. Carotid bruit is not present.  Cardiovascular: Regular rhythm, normal heart sounds and normal pulses.  Occasional extrasystoles are present. Bradycardia present. PMI is not displaced. Exam reveals no gallop and no friction rub.  No murmur heard. Pulmonary/Chest: Effort normal and breath sounds normal. No respiratory distress. He has no wheezes. He has no rales.  Abdominal: Soft. Bowel sounds are normal. He exhibits no distension. There is no abdominal tenderness. There is no rebound.  Musculoskeletal: Normal range of motion.  General: No deformity or edema.  Neurological: He is alert and oriented to person, place, and time. No cranial nerve deficit.  Skin: Skin is warm and dry. No rash noted. No erythema.  Psychiatric: He has a normal mood and affect. His behavior is normal. Judgment and thought content normal.  Vitals reviewed.   Adult ECG Report  Rate: 53 ;  Rhythm: sinus bradycardia and Incomplete RBBB.  Otherwise normal axis, intervals and durations.;   Narrative Interpretation: Relatively normal EKG  Full labs from PCP visit (10/12/2018) scanned.  Pertinent labs: BUN/Cr 15/1.04.  K+ 4.4.  TC 204, TG 122, HDL 62, LDL 122.  Assessment / Plan    Problem List Items Addressed This Visit    OSA on CPAP - Primary   Relevant Orders   EKG 12-Lead   CHEST PAIN UNSPECIFIED    He never said that he any chest pain.  He felt irregular heartbeats.  With him being his activities he is no longer having chest pain, would not do a stress test unless monitor echo results warrant further studies.      Sinus bradycardia    With resting sinus bradycardia, I am reluctant to actually treat PVCs unless there is a significant burden.  Will try to avoid AV nodal agents.      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  According to his report, he had PVCs with trigeminy during his last PCP office note.  Need to see PVC burden I also resting bradycardia.  Also need to exclude any structural abnormality since he had a history of heart murmur as a child.  Plan: 14-day ZIO patch monitor and 2D echo --> pending results of the monitor, may need to consider stress test (either Lexiscan Myoview to exclude ischemia or simple GXT to evaluate heart rate response to exercise and/or presence of PVCs with exertion)      Relevant Orders   EKG 12-Lead   ECHOCARDIOGRAM COMPLETE   LONG TERM MONITOR (3-14 DAYS)   History of heart murmur in childhood    He was told that he had a murmur as a child, I do not hear nothing on exam.  Not sure if there could been some type of congenital abnormality.  With him having PVCs need to exclude structural abnormality.  Plan: 2D echo      Relevant Orders   EKG 12-Lead   ECHOCARDIOGRAM COMPLETE      I spent a total of 26 minutes with the patient and chart review. >  50% of the time was spent in direct patient consultation.  Additional time spent with chart review (studies, outside notes, etc): 15 Total Time: 41 min   Current medicines are reviewed at length with the patient today.  (+/- concerns) N/A   Patient Instructions / Medication Changes & Studies & Tests Ordered   Patient Instructions  Medication Instructions:  NO CHANGES  If you need a refill on your cardiac medications before your next appointment, please call your pharmacy.   Lab work: NOT NEEDED   Testing/Procedures: WILL BE SCHEDULE AT [place] [facility] Your physician has requested that you have an echocardiogram. Echocardiography is a painless test that uses sound waves to create images of your heart. It provides your doctor with information about the size and shape of your heart and  how well your hearts chambers  and valves are working. This procedure takes approximately one hour. There are no restrictions for this procedure.  AND Your physician has recommended that you wear a 14 DAY ZIO-PATCH monitor. The Zio patch cardiac monitor continuously records heart rhythm data for up to 14 days, this is for patients being evaluated for multiple types heart rhythms. For the first 24 hours post application, please avoid getting the Zio monitor wet in the shower or by excessive sweating during exercise. After that, feel free to carry on with regular activities. Keep soaps and lotions away from the ZIO XT Patch.  This will be placed at our [facility] location - [address].      Follow-Up: At [facility], you and your health needs are our priority.  As part of our continuing mission to provide you with exceptional heart care, we have created designated Provider Care Teams.  These Care Teams include your primary Cardiologist (physician) and Advanced Practice Providers (APPs -  Physician Assistants and Nurse Practitioners) who all work together to provide you with the care you need, when you need it.  You will need a follow up appointment in 4-6 WEEKS - VIRTUAL IS OKAY .  Please call our office 2 months in advance to schedule this appointment.  You may see Glenetta Hew, MD or one of the following Advanced Practice Providers on your designated Care Team:    Rosaria Ferries, PA-C  Jory Sims, DNP, ANP  Any Other Special Instructions Will Be Listed Below (If Applicable).    Studies Ordered:   Orders Placed This Encounter  Procedures   LONG TERM MONITOR (3-14 DAYS)   EKG 12-Lead   ECHOCARDIOGRAM COMPLETE     Glenetta Hew, M.D., M.S. Interventional Cardiologist   Pager # [phone] Phone # [phone] [address] [place], [place] [address]   Thank you for choosing Heartcare at [facility]!!

## 2018-12-02 ENCOUNTER — Encounter: Payer: Self-pay | Admitting: Cardiology

## 2018-12-02 NOTE — Assessment & Plan Note (Signed)
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  According to his report, he had PVCs with trigeminy during his last PCP office note.  Need to see PVC burden I also resting bradycardia.  Also need to exclude any structural abnormality since he had a history of heart murmur as a child.  Plan: 14-day ZIO patch monitor and 2D echo --> pending results of the monitor, may need to consider stress test (either Lexiscan Myoview to exclude ischemia or simple GXT to evaluate heart rate response to exercise and/or presence of PVCs with exertion)

## 2018-12-02 NOTE — Assessment & Plan Note (Signed)
With resting sinus bradycardia, I am reluctant to actually treat PVCs unless there is a significant burden.  Will try to avoid AV nodal agents.

## 2018-12-02 NOTE — Assessment & Plan Note (Signed)
He was told that he had a murmur as a child, I do not hear nothing on exam.  Not sure if there could been some type of congenital abnormality.  With him having PVCs need to exclude structural abnormality.  Plan: 2D echo

## 2018-12-02 NOTE — Assessment & Plan Note (Signed)
He never said that he any chest pain.  He felt irregular heartbeats.  With him being his activities he is no longer having chest pain, would not do a stress test unless monitor echo results warrant further studies.

## 2018-12-03 ENCOUNTER — Other Ambulatory Visit: Payer: Self-pay

## 2018-12-03 ENCOUNTER — Ambulatory Visit (HOSPITAL_COMMUNITY): Payer: Medicare Other | Attending: Cardiology

## 2018-12-03 DIAGNOSIS — Z87891 Personal history of nicotine dependence: Secondary | ICD-10-CM | POA: Diagnosis not present

## 2018-12-03 DIAGNOSIS — R9431 Abnormal electrocardiogram [ECG] [EKG]: Secondary | ICD-10-CM | POA: Diagnosis not present

## 2018-12-03 DIAGNOSIS — I493 Ventricular premature depolarization: Secondary | ICD-10-CM | POA: Insufficient documentation

## 2018-12-03 DIAGNOSIS — Z8679 Personal history of other diseases of the circulatory system: Secondary | ICD-10-CM

## 2018-12-10 ENCOUNTER — Telehealth: Payer: Self-pay

## 2018-12-10 ENCOUNTER — Telehealth: Payer: Self-pay | Admitting: *Deleted

## 2018-12-10 NOTE — Telephone Encounter (Signed)
-----   Message from Leonie Man, MD sent at 12/05/2018  3:05 AM EDT ----- Echocardiogram results showed normal pump function ejection fraction in the upper normal range of 60 to 65%.  Right ventricle looks normal.  Atria look normal.  Normal valves with maybe mild aortic valve sclerosis but no stenosis.  This could explain a soft murmur.  Overall good news.  Pretty normal echocardiogram.  Glenetta Hew, MD  pls fwd to PCP: Cyndi Bender, PA-C

## 2018-12-10 NOTE — Telephone Encounter (Signed)
LM with detailed monitor instructions. Ordered 14 day ZIO to be mailed to pt's home address.

## 2018-12-10 NOTE — Telephone Encounter (Signed)
Left message to call back - echo results

## 2018-12-11 NOTE — Telephone Encounter (Signed)
The patient has been notified of the result and verbalized understanding.  All questions (if any) were answered. Raiford Simmonds, RN 12/11/2018 9:52 AM

## 2018-12-14 ENCOUNTER — Ambulatory Visit (INDEPENDENT_AMBULATORY_CARE_PROVIDER_SITE_OTHER): Payer: Medicare Other

## 2018-12-14 DIAGNOSIS — R001 Bradycardia, unspecified: Secondary | ICD-10-CM

## 2018-12-14 DIAGNOSIS — I493 Ventricular premature depolarization: Secondary | ICD-10-CM | POA: Diagnosis not present

## 2019-01-01 ENCOUNTER — Telehealth: Payer: Medicare Other | Admitting: Cardiology

## 2019-01-07 ENCOUNTER — Telehealth: Payer: Self-pay | Admitting: *Deleted

## 2019-01-07 NOTE — Telephone Encounter (Signed)
LEFT MESSAGE FOR PATIENT TO CALL BACK --  QUESTIONING IF ZIO HAS BEEN TURNED INTO COMPANY OF NOT MAY NEED TO SWITCH F/U APPT. TO CAPTURE BOTH RESULTS FOR F/U APPOINTMENT.

## 2019-01-08 NOTE — Telephone Encounter (Signed)
Patient waiting to hear back on results from monitor. Please advise.

## 2019-01-08 NOTE — Telephone Encounter (Signed)
Spoke to patient- result are not available - follow up appointment reschedule for 01/23/19 at 9 am

## 2019-01-09 ENCOUNTER — Telehealth: Payer: Medicare Other | Admitting: Cardiology

## 2019-01-23 ENCOUNTER — Encounter: Payer: Self-pay | Admitting: Cardiology

## 2019-01-23 ENCOUNTER — Telehealth: Payer: Self-pay | Admitting: *Deleted

## 2019-01-23 ENCOUNTER — Telehealth (INDEPENDENT_AMBULATORY_CARE_PROVIDER_SITE_OTHER): Payer: Medicare Other | Admitting: Cardiology

## 2019-01-23 VITALS — Ht 74.0 in | Wt 195.0 lb

## 2019-01-23 DIAGNOSIS — Z9989 Dependence on other enabling machines and devices: Secondary | ICD-10-CM

## 2019-01-23 DIAGNOSIS — R001 Bradycardia, unspecified: Secondary | ICD-10-CM | POA: Diagnosis not present

## 2019-01-23 DIAGNOSIS — Z8679 Personal history of other diseases of the circulatory system: Secondary | ICD-10-CM

## 2019-01-23 DIAGNOSIS — G4733 Obstructive sleep apnea (adult) (pediatric): Secondary | ICD-10-CM | POA: Diagnosis not present

## 2019-01-23 DIAGNOSIS — I493 Ventricular premature depolarization: Secondary | ICD-10-CM

## 2019-01-23 DIAGNOSIS — I208 Other forms of angina pectoris: Secondary | ICD-10-CM

## 2019-01-23 NOTE — Assessment & Plan Note (Signed)
He does have resting heart rate is relatively slow, but not overly slow by monitor.  Heart rates were in the 30s but usually during hours of sleep.  No indication for pacemaker.  However with PVCs, I was reluctant to treat aggressively given the bradycardia.

## 2019-01-23 NOTE — Assessment & Plan Note (Signed)
Pretty significant PVC burden.  Echocardiogram is pretty normal.  Needs ischemic evaluation to exclude coronary ischemia mediated PVCs.  For now, with lack of symptoms, would be reluctant to treat due to resting bradycardia.  Plan: Treadmill Myoview

## 2019-01-23 NOTE — Progress Notes (Signed)
Virtual Visit via Telephone Note   This visit type was conducted due to national recommendations for restrictions regarding the COVID-19 Pandemic (e.g. social distancing) in an effort to limit this patient's exposure and mitigate transmission in our community.  Due to his co-morbid illnesses, this patient is at least at moderate risk for complications without adequate follow up.  This format is felt to be most appropriate for this patient at this time.  The patient did not have access to video technology/had technical difficulties with video requiring transitioning to audio format only (telephone).  All issues noted in this document were discussed and addressed.  No physical exam could be performed with this format.  Please refer to the patient's chart for his  consent to telehealth for [facility].   Patient has given verbal permission to conduct this visit via virtual appointment and to bill insurance 01/23/2019 5:22 PM     Evaluation Performed:  Follow-up visit  Date:  01/23/2019   ID:  Alejandro, Bailey 1945-11-26, MRN [id number]  Patient Location: Home Provider Location: Home  PCP:  Cyndi Bender, PA-C  Cardiologist:  Glenetta Hew, MD  Electrophysiologist:  None   Chief Complaint:   Chief Complaint  Patient presents with  . Follow-up    Test results  . Palpitations    History of Present Illness:    Alejandro Bailey is a 73 y.o. male with PMH notable for OSA and PAT who presents via audio/video conferencing for a telehealth visit today.  Alejandro Bailey was last seen on October 9 for evaluation of PVCs at the request of Cyndi Bender, PA-C  --Echocardiogram and event monitor ordered.  Hospitalizations:  . None   Prior CV studies:   The following studies were reviewed today: . Echo: EF-65%.  Normal LV size and function.  No LVH.  Normal RV size and function.  Normal atriae.  Mild aortic valve sclerosis with no stenosis.  Otherwise normal echo. . Event monitor:  o  Frequent PVCs (11.3%.  Rare couplets and triplets.  Occasional bigeminy and trigeminy.  Predominant rhythm sinus rhythm-1 degree AVB.--> rates: Minimum 38 bpm, maximum 121 bpm, average 54 bpm.  16 episodes noted of brief runs of SVT-PAT: Fastest was 162 BPM,, 10 beats. Longest lasting 5.9 sec-rate 107 bpm.  Inerval History   Alejandro Bailey is being evaluated today after his echocardiogram and monitor results.  Did show a high PVC burden, but not overly symptomatic.  He is pretty active and really has not had any abnormal symptoms.  Maybe 1 or 2 episodes of feeling dizzy but usually when he goes to work without having a full meal.  Once he eats something feels better. 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  With that he denies any significant dyspnea at work chest tightness/pressure.  He continues to note that he is doing very well with CPAP.  Says that he cannot sleep without it.  He gets great sleep with it.  Tolerating well.   Cardiovascular ROS: positive for - palpitations, rapid heart rate and Much less frequent.  Just occasional/fleeting palpitations.  Does have some exertional dyspnea if he overdoes it.  negative for - chest pain, edema, irregular heartbeat, orthopnea, paroxysmal nocturnal dyspnea, shortness of breath or Syncope/near syncope, TIA/amaurosis fugax.  Claudication.   ROS:  Please see the history of present illness.    The  patient does not have symptoms concerning for COVID-19 infection (fever, chills, cough, or new shortness of breath).  Review of Systems  Constitutional: Negative for malaise/fatigue.  HENT: Positive for congestion (sinus drainage). Negative for nosebleeds.   Respiratory: Negative for cough and shortness of breath.   Gastrointestinal: Negative for blood in stool and melena.  Genitourinary: Negative for hematuria.  Musculoskeletal: Negative for falls and joint pain.   Neurological: Positive for dizziness (if goes to wrok without eating). Negative for seizures and weakness (no more weak spells).  Endo/Heme/Allergies: Negative for environmental allergies.  Psychiatric/Behavioral: The patient is not nervous/anxious.   All other systems reviewed and are negative.   The patient is practicing social distancing.  Past Medical History:  Diagnosis Date  . 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 NECK AND BACK PAIN 11/08/2007  . Nocturia 11/13/2008  . Obstructive sleep apnea on CPAP    Uses CPAP routinely  . 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 PAT (paroxysmal atrial tachycardia) ([place])    Distant h/o -- not sure what ll w/u was done  . 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 TENDINITIS, LEFT WRIST 03/27/2009   Past Surgical History:  Procedure Laterality Date  . COLONOSCOPY    . NM MYOVIEW LTD  10/2008   EF 60-70%.  No ischemia or infarction  . POLYPECTOMY    . TUMOR REMOVAL     Right femoral intertrochanteric area.  Osteosarcoma     Current Meds  Medication Sig  . acetaminophen (TYLENOL) 650 MG CR tablet Take 650 mg by mouth daily.  . Multiple Vitamin (MULTIVITAMIN) tablet Take 1 tablet by mouth daily.       Allergies:   Patient has no known allergies.   Social History   Tobacco Use  . Smoking status: Former Smoker    Packs/day: 1.00    Years: 50.00    Pack years: 50.00    Types: Cigarettes    Quit date: 02/21/2009    Years since quitting: 9.9  . Smokeless tobacco: Never Used  Substance Use Topics  . Alcohol use: Yes    Comment: occ beer 1-2 times a week  . Drug use: No     Family Hx: The patient's family history includes Cancer in his father; Diabetes in his brother and mother; Heart disease in his mother; Heart failure in his maternal grandmother and mother. There is no history of Colon cancer.   Labs/Other Tests and Data Reviewed:    EKG:  No ECG reviewed.  Recent Labs: No results found for  requested labs within last 8760 hours.   Recent Lipid Panel Lab Results  Component Value Date/Time   CHOL 217 (H) 10/20/2008 09:31 AM   TRIG 108.0 10/20/2008 09:31 AM   HDL 44.00 10/20/2008 09:31 AM   CHOLHDL 5 10/20/2008 09:31 AM   LDLDIRECT 151.0 10/20/2008 09:31 AM    Wt Readings from Last 3 Encounters:  01/23/19 195 lb (88.5 kg)  11/30/18 203 lb (92.1 kg)  09/05/14 212 lb 12.8 oz (96.5 kg)     Objective:    Vital Signs:  Ht 6\' 2"  (1.88 m)   Wt 195 lb (88.5 kg)   BMI 25.04 kg/m   VITAL SIGNS:  reviewed not done Pleasant male in No acute distress. A&O x 3.  Normal Mood & Affect Non-labored respirations   ASSESSMENT & PLAN:    Problem List Items Addressed This Visit    OSA on CPAP (Chronic)   Sinus bradycardia (  Chronic)    He does have resting heart rate is relatively slow, but not overly slow by monitor.  Heart rates were in the 30s but usually during hours of sleep.  No indication for pacemaker.  However with PVCs, I was reluctant to treat aggressively given the bradycardia.      Relevant Orders   STRESS MYOVIEW --- Myocardial Perfusion Imaging   Frequent PVCs - Primary (Chronic)    Pretty significant PVC burden.  Echocardiogram is pretty normal.  Needs ischemic evaluation to exclude coronary ischemia mediated PVCs.  For now, with lack of symptoms, would be reluctant to treat due to resting bradycardia.  Plan: Treadmill Myoview      Relevant Orders   STRESS MYOVIEW --- Myocardial Perfusion Imaging   CHEST PAIN UNSPECIFIED    More likely related to palpitations. Next however with 11% PVC burden, needs an ischemic evaluation.  Plan: Treadmill Myoview --.  We will need to have Covid testing prior to this, but needed treadmill portion to evaluate PVC response to exercise, and Myoview for better estimation of ischemia.      History of heart murmur in childhood    No evidence of any childhood murmurs, but does have some aortic sclerosis which could explain a  soft systolic murmur if heard.         COVID-19 Education: The signs and symptoms of COVID-19 were discussed with the patient and how to seek care for testing (follow up with PCP or arrange E-visit).   The importance of social distancing was discussed today.  Time:   Today, I have spent 18 minutes with the patient with telehealth technology discussing the above problems.     Medication Adjustments/Labs and Tests Ordered: Current medicines are reviewed at length with the patient today.  Concerns regarding medicines are outlined above.   Patient Instructions   Medication Instructions:  None for now  *If you need a refill on your cardiac medications before your next appointment, please call your pharmacy*  Lab Work:  N/A  .  Testing/Procedures: WILL BE SCHEDULE AT [address]  YOU WILL NEED TO HAVE A COVID TEST PRIOR TO TESTING - 3 DAYS PRIOR THEN SELF ISOLATE UNTIL EXERCISE STRESS MYOVIEW - YOU WILL GO TO [address] ( [place] TEST ADMINSTERED . Your physician has requested that you have en exercise stress myoview. For further information please visit [email or web address]. Please follow instruction sheet, as given. .  Follow-Up: At [facility], you and your health needs are our priority.  As part of our continuing mission to provide you with exceptional heart care, we have created designated Provider Care Teams.  These Care Teams include your primary Cardiologist (physician) and Advanced Practice Providers (APPs -  Physician Assistants and Nurse Practitioners) who all work together to provide you with the care you need, when you need it.  Your next appointment:   6 month(s)- June 2021 UNLESS ABNORMAL  WILL MAKE AN APPOINTMENT SOONER  The format for your next appointment:   In Person  Provider:    Glenetta Hew, MD    Other Instructions None    Signed, Glenetta Hew, MD  01/23/2019 5:22 PM    [facility]

## 2019-01-23 NOTE — Telephone Encounter (Signed)
SPOKE TO PATIENT.  INSTRUCTION WAS GIVEN FROM TODAY'S VIRTUAL VISIT 01/23/19 .   DISCUSS UPCOMING STRESS MYOVIEW - AND NEEDING COVID TEST 3 DAYS PRIOR AND SELF ISOLATING UNTIL STRESS MYOVIEW.    PATIENT WOULD LIKE SCHEDULE STRESS ON Friday AND COVID ON A Tuesday.AWARE WILL RECEIVE CALL FROM SCHEDULER.    AVS SUMMARY  SENT VIA MYCHART.  PATIENT VERBALIZED UNDERSTANDING.

## 2019-01-23 NOTE — Patient Instructions (Addendum)
  Medication Instructions:  None for now  *If you need a refill on your cardiac medications before your next appointment, please call your pharmacy*  Lab Work:  N/A  .  Testing/Procedures: WILL BE SCHEDULE AT [address]  YOU WILL NEED TO HAVE A COVID TEST PRIOR TO TESTING - 3 DAYS PRIOR THEN SELF ISOLATE UNTIL EXERCISE STRESS MYOVIEW - YOU WILL GO TO [address] ( [address] TEST ADMINSTERED . Your physician has requested that you have en exercise stress myoview. For further information please visit [email or web address]. Please follow instruction sheet, as given. .  Follow-Up: At [facility], you and your health needs are our priority.  As part of our continuing mission to provide you with exceptional heart care, we have created designated Provider Care Teams.  These Care Teams include your primary Cardiologist (physician) and Advanced Practice Providers (APPs -  Physician Assistants and Nurse Practitioners) who all work together to provide you with the care you need, when you need it.  Your next appointment:   6 month(s)- June 2021 UNLESS ABNORMAL  WILL MAKE AN APPOINTMENT SOONER  The format for your next appointment:   In Person  Provider:    Glenetta Hew, MD    Other Instructions None

## 2019-01-23 NOTE — Assessment & Plan Note (Signed)
No evidence of any childhood murmurs, but does have some aortic sclerosis which could explain a soft systolic murmur if heard.

## 2019-01-23 NOTE — Assessment & Plan Note (Signed)
More likely related to palpitations. Next however with 11% PVC burden, needs an ischemic evaluation.  Plan: Treadmill Myoview --.  We will need to have Covid testing prior to this, but needed treadmill portion to evaluate PVC response to exercise, and Myoview for better estimation of ischemia.

## 2019-01-29 ENCOUNTER — Other Ambulatory Visit (HOSPITAL_COMMUNITY)
Admission: RE | Admit: 2019-01-29 | Discharge: 2019-01-29 | Disposition: A | Discharge status: Home / Self Care | Payer: Medicare Other | Source: Ambulatory Visit | Attending: Cardiology | Admitting: Cardiology

## 2019-01-29 DIAGNOSIS — Z20828 Contact with and (suspected) exposure to other viral communicable diseases: Secondary | ICD-10-CM | POA: Insufficient documentation

## 2019-01-29 DIAGNOSIS — Z01812 Encounter for preprocedural laboratory examination: Secondary | ICD-10-CM | POA: Insufficient documentation

## 2019-01-30 ENCOUNTER — Telehealth (HOSPITAL_COMMUNITY): Payer: Self-pay

## 2019-01-30 LAB — NOVEL CORONAVIRUS, NAA (HOSP ORDER, SEND-OUT TO REF LAB; TAT 18-24 HRS): SARS-CoV-2, NAA: NOT DETECTED

## 2019-01-30 NOTE — Telephone Encounter (Signed)
Encounter complete. 

## 2019-02-01 ENCOUNTER — Other Ambulatory Visit: Payer: Self-pay

## 2019-02-01 ENCOUNTER — Ambulatory Visit (HOSPITAL_COMMUNITY)
Admission: RE | Admit: 2019-02-01 | Discharge: 2019-02-01 | Disposition: A | Discharge status: Home / Self Care | Payer: Medicare Other | Source: Ambulatory Visit | Attending: Cardiovascular Disease | Admitting: Cardiovascular Disease

## 2019-02-01 DIAGNOSIS — I493 Ventricular premature depolarization: Secondary | ICD-10-CM | POA: Diagnosis present

## 2019-02-01 DIAGNOSIS — R001 Bradycardia, unspecified: Secondary | ICD-10-CM | POA: Insufficient documentation

## 2019-02-01 LAB — MYOCARDIAL PERFUSION IMAGING
LV dias vol: 129 mL (ref 62–150)
LV sys vol: 62 mL
Peak HR: 82 {beats}/min
Rest HR: 63 {beats}/min
SDS: 1
SRS: 3
SSS: 4
TID: 1.09

## 2019-02-01 MED ORDER — REGADENOSON 0.4 MG/5ML IV SOLN
0.4000 mg | Freq: Once | INTRAVENOUS | Status: AC
  Administered 2019-02-01: 0.4 mg via INTRAVENOUS

## 2019-02-01 MED ORDER — TECHNETIUM TC 99M TETROFOSMIN IV KIT
31.8000 | PACK | Freq: Once | INTRAVENOUS | Status: AC | PRN
  Administered 2019-02-01: 31.8 via INTRAVENOUS
  Filled 2019-02-01: qty 32

## 2019-02-01 MED ORDER — TECHNETIUM TC 99M TETROFOSMIN IV KIT
11.0000 | PACK | Freq: Once | INTRAVENOUS | Status: AC | PRN
  Administered 2019-02-01: 11 via INTRAVENOUS
  Filled 2019-02-01: qty 11

## 2019-07-26 ENCOUNTER — Encounter: Payer: Self-pay | Admitting: Cardiology

## 2019-07-26 ENCOUNTER — Telehealth (INDEPENDENT_AMBULATORY_CARE_PROVIDER_SITE_OTHER): Payer: Medicare Other | Admitting: Cardiology

## 2019-07-26 VITALS — Ht 74.0 in | Wt 195.4 lb

## 2019-07-26 DIAGNOSIS — R001 Bradycardia, unspecified: Secondary | ICD-10-CM | POA: Diagnosis not present

## 2019-07-26 DIAGNOSIS — Z9989 Dependence on other enabling machines and devices: Secondary | ICD-10-CM

## 2019-07-26 DIAGNOSIS — G4733 Obstructive sleep apnea (adult) (pediatric): Secondary | ICD-10-CM

## 2019-07-26 DIAGNOSIS — I493 Ventricular premature depolarization: Secondary | ICD-10-CM

## 2019-07-26 NOTE — Patient Instructions (Signed)

## 2019-07-26 NOTE — Progress Notes (Signed)
Virtual Visit via Telephone Note   This visit type was conducted due to national recommendations for restrictions regarding the COVID-19 Pandemic (e.g. social distancing) in an effort to limit this patient's exposure and mitigate transmission in our community.  Due to his co-morbid illnesses, this patient is at least at moderate risk for complications without adequate follow up.  This format is felt to be most appropriate for this patient at this time.  The patient did not have access to video technology/had technical difficulties with video requiring transitioning to audio format only (telephone).  All issues noted in this document were discussed and addressed.  No physical exam could be performed with this format.  Please refer to the patient's chart for his  consent to telehealth for [facility].   The patient was identified using 2 identifiers.  Date:  07/26/2019   ID:  Alejandro Bailey, DOB 02-28-1945, MRN [id number]  Patient Location: Home Provider Location: Home  PCP:  Cyndi Bender, PA-C  Cardiologist:  Glenetta Hew, MD  Electrophysiologist:  None   Evaluation Performed:  Follow-Up Visit  Chief Complaint:  none  History of Present Illness:    Alejandro Bailey is a 74 y.o. male with a history of PVCs and palpitations.  He was referred to Dr. Ellyn Hack for further evaluation.  He has a history of sleep apnea and is compliant with CPAP.  Echocardiogram and stress Myoview look good.  A monitor showed brief runs of PSVT.  He was contacted today for routine follow-up.  Since we saw him last he has been doing well.  He is active, he works 3 nights a week at [employer] and does a lot of physical work around his home.  He denies any palpitations or tachycardia.  The patient does not have symptoms concerning for COVID-19 infection (fever, chills, cough, or new shortness of breath). He has had his vaccine.   Past Medical History:  Diagnosis Date  . ERECTILE DYSFUNCTION, ORGANIC  11/13/2008  . NECK AND BACK PAIN 11/08/2007  . Nocturia 11/13/2008  . Obstructive sleep apnea on CPAP    Uses CPAP routinely  . Osteosarcoma of femur, right ([place]) 2703   s/p resection  . PAT (paroxysmal atrial tachycardia) ([place])    Distant h/o -- not sure what ll w/u was done  . 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 TENDINITIS, LEFT WRIST 03/27/2009   Past Surgical History:  Procedure Laterality Date  . COLONOSCOPY    . NM MYOVIEW LTD  10/2008   EF 60-70%.  No ischemia or infarction  . POLYPECTOMY    . TUMOR REMOVAL     Right femoral intertrochanteric area.  Osteosarcoma     Current Meds  Medication Sig  . acetaminophen (TYLENOL) 650 MG CR tablet Take 650 mg by mouth daily.  . Multiple Vitamin (MULTIVITAMIN) tablet Take 1 tablet by mouth daily.       Allergies:   Patient has no known allergies.   Social History   Tobacco Use  . Smoking status: Former Smoker    Packs/day: 1.00    Years: 50.00    Pack years: 50.00    Types: Cigarettes    Quit date: 02/21/2009    Years since quitting: 10.4  . Smokeless tobacco: Never Used  Substance Use Topics  . Alcohol use: Yes    Comment: occ beer 1-2 times a week  . Drug use: No     Family Hx: The patient's family history includes Cancer in his  father; Diabetes in his brother and mother; Heart disease in his mother; Heart failure in his maternal grandmother and mother. There is no history of Colon cancer.  ROS:   Please see the history of present illness.    All other systems reviewed and are negative.   Prior CV studies:   The following studies were reviewed today:  Stress Myoview 02/01/2019  Labs/Other Tests and Data Reviewed:    EKG:  An ECG dated 11/30/2018 was personally reviewed today and demonstrated:  NSR-SB-HR 53, incomplete LBBB  Recent Labs: No results found for requested labs within last 8760 hours.   Recent Lipid Panel Lab Results  Component Value Date/Time   CHOL 217 (H) 10/20/2008 09:31 AM   TRIG  108.0 10/20/2008 09:31 AM   HDL 44.00 10/20/2008 09:31 AM   CHOLHDL 5 10/20/2008 09:31 AM   LDLDIRECT 151.0 10/20/2008 09:31 AM    Wt Readings from Last 3 Encounters:  07/26/19 195 lb 6.4 oz (88.6 kg)  02/01/19 195 lb (88.5 kg)  01/23/19 195 lb (88.5 kg)     Objective:    Vital Signs:  Ht 6\' 2"  (1.88 m)   Wt 195 lb 6.4 oz (88.6 kg)   BMI 25.09 kg/m    VITAL SIGNS:  reviewed  ASSESSMENT & PLAN:    PVCs- He did have a significant PVC burden on his monitor but he remains asymptomatic.  Echo showed normal LVF, Myoview was low risk.  He has baseline bradycardia but is asymptomatic.   OSA- Compliant with C-pap  Plan- NO change- PRN follow up.  COVID-19 Education: The signs and symptoms of COVID-19 were discussed with the patient and how to seek care for testing (follow up with PCP or arrange E-visit).  The importance of social distancing was discussed today.  Time:   Today, I have spent 10 minutes with the patient with telehealth technology discussing the above problems.     Medication Adjustments/Labs and Tests Ordered: Current medicines are reviewed at length with the patient today.  Concerns regarding medicines are outlined above.   Tests Ordered: No orders of the defined types were placed in this encounter.   Medication Changes: No orders of the defined types were placed in this encounter.   Follow Up:  Either In Person or Virtual prn  Signed, Kerin Ransom, PA-C  07/26/2019 8:21 AM    [employer]
